# Patient Record
Sex: Female | Born: 1995
Health system: Southern US, Community
[De-identification: ages and names within clinical notes are randomized; demographics above are authoritative.]

## PROBLEM LIST (undated history)

## (undated) DIAGNOSIS — N946 Dysmenorrhea, unspecified: Secondary | ICD-10-CM

## (undated) DIAGNOSIS — D649 Anemia, unspecified: Secondary | ICD-10-CM

## (undated) HISTORY — PX: WISDOM TOOTH EXTRACTION: SHX21

## (undated) HISTORY — DX: Dysmenorrhea, unspecified: N94.6

## (undated) HISTORY — DX: Anemia, unspecified: D64.9

---
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
## 2019-07-23 DIAGNOSIS — Z20822 Contact with and (suspected) exposure to covid-19: Secondary | ICD-10-CM | POA: Diagnosis not present

## 2019-07-23 DIAGNOSIS — Z03818 Encounter for observation for suspected exposure to other biological agents ruled out: Secondary | ICD-10-CM | POA: Diagnosis not present

## 2019-10-12 ENCOUNTER — Ambulatory Visit: Payer: Self-pay | Attending: Internal Medicine

## 2019-10-12 DIAGNOSIS — Z23 Encounter for immunization: Secondary | ICD-10-CM

## 2019-10-12 MED FILL — PFIZER-BIONTECH COVID-19 VA: 30 | 1 days supply | Qty: 0 | Fill #0

## 2019-10-12 MED FILL — FLUARIX QUADRIVALENT 0.5 ML: 0.5 | 1 days supply | Qty: 1 | Fill #0

## 2019-10-12 NOTE — Progress Notes (Signed)
   Covid-19 Vaccination Clinic  Name:  Jeimy Bickert    MRN: [id number] DOB: 07/07/1995  10/12/2019  Ms. Mcelhannon was observed post Covid-19 immunization for 15 minutes without incident. She was provided with Vaccine Information Sheet and instruction to access the V-Safe system. Vaccinated By: Arma Heading.  Ms. Passon was instructed to call 911 with any severe reactions post vaccine: Marland Kitchen Difficulty breathing  . Swelling of face and throat  . A fast heartbeat  . A bad rash all over body  . Dizziness and weakness   Immunizations Administered    Name Date Dose VIS Date Route   Pfizer COVID-19 Vaccine 10/12/2019 12:59 PM 0.3 mL 03/17/2018 Intramuscular   Manufacturer: [employer], [employer]   Lot: [id number]   NDC: [id number]

## 2022-09-02 ENCOUNTER — Ambulatory Visit (HOSPITAL_BASED_OUTPATIENT_CLINIC_OR_DEPARTMENT_OTHER): Payer: No Typology Code available for payment source | Admitting: Student

## 2022-09-02 ENCOUNTER — Encounter (HOSPITAL_BASED_OUTPATIENT_CLINIC_OR_DEPARTMENT_OTHER): Payer: Self-pay | Admitting: Student

## 2022-09-02 DIAGNOSIS — S86812A Strain of other muscle(s) and tendon(s) at lower leg level, left leg, initial encounter: Secondary | ICD-10-CM

## 2022-09-03 NOTE — Progress Notes (Signed)
Chief Complaint: Left calf pain     History of Present Illness:    Rachel Chambers is a 27 y.o. female presents today for evaluation of pain in her left calf.  This began earlier today as she was on a run in the neighborhood.  While running up a hill, she reports that she felt a pop the back of her left leg, followed by pain and difficulty walking.  Pain is located over the medial aspect of the left mid calf and is 4 out of 10 in severity.  Does note increased discomfort particularly while walking down stairs.  Denies any previous injury or surgery to this area.  Denies numbness, tingling, or noticeable weakness.   Surgical History:   None  PMH/PSH/Family History/Social History/Meds/Allergies:    Past Medical History:  Diagnosis Date   Anemia    Dysmenorrhea    Past Surgical History:  Procedure Laterality Date   WISDOM TOOTH EXTRACTION     Social History   Socioeconomic History   Marital status: Unknown    Spouse name: Not on file   Number of children: Not on file   Years of education: Not on file   Highest education level: Not on file  Occupational History   Not on file  Tobacco Use   Smoking status: Never   Smokeless tobacco: Never  Substance and Sexual Activity   Alcohol use: No    Alcohol/week: 0.0 standard drinks of alcohol   Drug use: No   Sexual activity: Never    Partners: Male  Other Topics Concern   Not on file  Social History Narrative   Not on file   Social Determinants of Health   Financial Resource Strain: Not on file  Food Insecurity: Not on file  Transportation Needs: Not on file  Physical Activity: Not on file  Stress: Not on file  Social Connections: Not on file   Family History  Problem Relation Age of Onset   Breast cancer Mother    Polycystic ovary syndrome Mother    Hyperlipidemia Mother    Hyperlipidemia Father    Heart attack Paternal Grandfather    Liver cancer Paternal Grandfather     Cystic fibrosis Paternal Aunt    Skin cancer Paternal Grandmother    Melanoma Paternal Grandmother    No Known Allergies Current Outpatient Medications  Medication Sig Dispense Refill   citalopram (CELEXA) 20 MG tablet 1 tab a day 90 tablet 3   Probiotic Product (PROBIOTIC DAILY PO) Take by mouth.     No current facility-administered medications for this visit.   No results found.  Review of Systems:   A ROS was performed including pertinent positives and negatives as documented in the HPI.  Physical Exam :   Constitutional: NAD and appears stated age Neurological: Alert and oriented Psych: Appropriate affect and cooperative There were no vitals taken for this visit.   Comprehensive Musculoskeletal Exam:    Tenderness to palpation over the medial head of the left gastrocnemius.  No evidence of erythema or ecchymosis.  Full AROM of the left knee and ankle.  No visible or palpable deformity of the Achilles tendon and negative Thompson test.  Strength with dorsiflexion and plantarflexion is 5/5.  DP and PT pulses are 2+.  Distal neurosensory intact.  Imaging:  Assessment:   27 y.o. female with acute left medial calf pain.  This began after feeling a pop while running uphill earlier today.  Based on exam and location of symptoms, I have very low suspicion for an Achilles injury.  I believe this is overall consistent with a medial gastroc strain.  Exam is otherwise reassuring with no notable strength deficits.  Will plan to proceed with conservative management including NSAIDs as needed, gentle stretching, and ice/elevation.  I do suspect that she will continue to improve and encouraged gradual return to full activity as she continues to do so.  Will have her return to clinic as needed for reevaluation should symptoms not improve within the next 4 weeks.  Plan :    - Conservative management for calf strain - Return to clinic as needed     I personally saw and evaluated the  patient, and participated in the management and treatment plan.  Hazle Nordmann, PA-C Orthopedics  This document was dictated using Conservation officer, historic buildings. A reasonable attempt at proof reading has been made to minimize errors.

## 2022-11-29 ENCOUNTER — Telehealth: Payer: Self-pay | Admitting: Plastic Surgery

## 2022-11-29 NOTE — Telephone Encounter (Signed)
Called pt 11-29-22, spoke with pt letting her know provider will be in sx, pt was not happy to move her apt at all, but she did agree to come in the next day. She said [place] of [place] and she has been waiting 3 months for this apt.

## 2022-12-13 ENCOUNTER — Institutional Professional Consult (permissible substitution): Payer: No Typology Code available for payment source | Admitting: Plastic Surgery

## 2022-12-14 ENCOUNTER — Encounter: Payer: Self-pay | Admitting: Plastic Surgery

## 2022-12-14 ENCOUNTER — Other Ambulatory Visit (HOSPITAL_COMMUNITY)
Admission: RE | Admit: 2022-12-14 | Discharge: 2022-12-14 | Disposition: A | Discharge status: Home / Self Care | Payer: No Typology Code available for payment source | Source: Ambulatory Visit | Attending: Plastic Surgery | Admitting: Plastic Surgery

## 2022-12-14 ENCOUNTER — Ambulatory Visit (INDEPENDENT_AMBULATORY_CARE_PROVIDER_SITE_OTHER): Payer: No Typology Code available for payment source | Admitting: Plastic Surgery

## 2022-12-14 VITALS — BP 106/72 | HR 74 | Ht 63.0 in | Wt 162.6 lb

## 2022-12-14 DIAGNOSIS — L249 Irritant contact dermatitis, unspecified cause: Secondary | ICD-10-CM | POA: Diagnosis not present

## 2022-12-14 DIAGNOSIS — L989 Disorder of the skin and subcutaneous tissue, unspecified: Secondary | ICD-10-CM | POA: Insufficient documentation

## 2022-12-14 NOTE — Progress Notes (Signed)
     Patient ID: Rachel Chambers, female    DOB: 1995/03/11, 27 y.o.   MRN: [id number]   Chief Complaint  Patient presents with   Consult    The patient is a 27 year old female here for evaluation of her right upper lip.  The patient noticed some redness on her upper lip approximately 3 years ago.  It has gotten larger.  She seen dermatology.  She has tried creams and lotions and steroids without any improvement.  Sometimes it gets redder than others and painful but it never really acted like a fever blister or hurt like a fever blister.  She has been treated with famciclovir and that did not help either.  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    Review of Systems  Constitutional: Negative.   HENT: Negative.    Eyes: Negative.   Respiratory: Negative.    Cardiovascular: Negative.   Gastrointestinal: Negative.   Endocrine: Negative.   Genitourinary: Negative.   Musculoskeletal: Negative.     Past Medical History:  Diagnosis Date   Anemia    Dysmenorrhea     Past Surgical History:  Procedure Laterality Date   WISDOM TOOTH EXTRACTION        Current Outpatient Medications:    Probiotic Product (PROBIOTIC DAILY PO), Take by mouth., Disp: , Rfl:    citalopram (CELEXA) 20 MG tablet, 1 tab a day, Disp: 90 tablet, Rfl: 3   Objective:   Vitals:   12/14/22 0808  BP: 106/72  Pulse: 74  SpO2: 99%    Physical Exam Constitutional:      Appearance: Normal appearance.  HENT:     Head: Atraumatic.     Mouth/Throat:   Cardiovascular:     Rate and Rhythm: Normal rate.     Pulses: Normal pulses.  Musculoskeletal:        General: No swelling.  Skin:    General: Skin is warm.     Capillary Refill: Capillary refill takes less than 2 seconds.     Coloration: Skin is not jaundiced or pale.     Findings: Erythema and lesion present. No bruising.  Neurological:     Mental Status: She is alert and oriented to person, place, and time.   Psychiatric:        Mood and Affect: Mood normal.        Behavior: Behavior normal.        Thought Content: Thought content normal.        Judgment: Judgment normal.     Assessment & Plan:  Changing skin lesion  Since it is not known what the lesion is we are going to get a biopsy.  That will help determine next course of action.  Pictures were obtained of the patient and placed in the chart with the patient's or guardian's permission.   Alena Bills Tin Engram, DO

## 2022-12-14 NOTE — Progress Notes (Signed)
Procedure Note  Preoperative Dx: changing skin lesion of right upper lip  Postoperative Dx: Same  Procedure: Biopsy of right upper lip changing skin lesion 2 mm  Anesthesia: Lidocaine 1% with 1:100,000 epinephrine  Indication for Procedure: skin lesion  Description of Procedure: Risks and complications were explained to the patient.  Consent was confirmed and the patient understands the risks and benefits.  The potential complications and alternatives were explained and the patient consents.  The patient expressed understanding the option of not having the procedure and the risks of a scar.  Time out was called and all information was confirmed to be correct.    The area was prepped and drapped.  Lidocaine 1% with epinephrine was injected in the subcutaneous area.  After waiting several minutes for the local to take affect a 2 mm punch biopsy was used to obtain a skin specimen. A dressing was applied.  The patient was given instructions on how to care for the area and a follow up appointment.  Rachel Chambers tolerated the procedure well and there were no complications. The specimen was sent to pathology.

## 2022-12-23 LAB — SURGICAL PATHOLOGY

## 2024-03-16 ENCOUNTER — Ambulatory Visit: Admitting: Plastic Surgery
